# Patient Record
Sex: Female | Born: 1955 | Race: White | Hispanic: No | Marital: Married
Health system: Southern US, Community
[De-identification: ages and names within clinical notes are randomized; demographics above are authoritative.]

## PROBLEM LIST (undated history)

## (undated) DIAGNOSIS — I10 Essential (primary) hypertension: Secondary | ICD-10-CM

---

## 2017-10-21 ENCOUNTER — Other Ambulatory Visit (HOSPITAL_COMMUNITY): Payer: Self-pay | Admitting: Chiropractic Medicine

## 2017-10-21 DIAGNOSIS — M25551 Pain in right hip: Secondary | ICD-10-CM

## 2017-10-28 ENCOUNTER — Ambulatory Visit (HOSPITAL_COMMUNITY)
Admission: RE | Admit: 2017-10-28 | Discharge: 2017-10-28 | Disposition: A | Discharge status: Home / Self Care | Payer: BLUE CROSS/BLUE SHIELD | Source: Ambulatory Visit | Attending: Chiropractic Medicine | Admitting: Chiropractic Medicine

## 2017-10-28 ENCOUNTER — Encounter (HOSPITAL_COMMUNITY): Payer: Self-pay

## 2017-10-28 DIAGNOSIS — M25551 Pain in right hip: Secondary | ICD-10-CM

## 2017-10-28 DIAGNOSIS — S73191A Other sprain of right hip, initial encounter: Secondary | ICD-10-CM | POA: Insufficient documentation

## 2017-10-28 DIAGNOSIS — X58XXXA Exposure to other specified factors, initial encounter: Secondary | ICD-10-CM | POA: Insufficient documentation

## 2017-10-28 DIAGNOSIS — M949 Disorder of cartilage, unspecified: Secondary | ICD-10-CM | POA: Insufficient documentation

## 2017-10-28 HISTORY — DX: Essential (primary) hypertension: I10

## 2017-10-28 MED ORDER — POVIDONE-IODINE 10 % EX SOLN
CUTANEOUS | Status: AC
  Filled 2017-10-28: qty 15

## 2017-10-28 MED ORDER — LIDOCAINE HCL (PF) 1 % IJ SOLN
INTRAMUSCULAR | Status: AC
  Administered 2017-10-28: 3 mL
  Filled 2017-10-28: qty 5

## 2017-10-28 MED ORDER — IOPAMIDOL (ISOVUE-300) INJECTION 61%
INTRAVENOUS | Status: AC
  Administered 2017-10-28: 0.05 mL
  Filled 2017-10-28: qty 30

## 2017-10-28 MED ORDER — GADOBENATE DIMEGLUMINE 529 MG/ML IV SOLN
5.0000 mL | Freq: Once | INTRAVENOUS | Status: AC | PRN
  Administered 2017-10-28: 5 mL via INTRA_ARTICULAR

## 2017-10-28 NOTE — Procedures (Signed)
Preprocedure Dx: RT hip pain Postprocedure Dx: RT hip pain Procedure  Fluoroscopically guided RT joint injection for MR arthrography arthrogrpahy Radiologist:  Tyron RussellBoles Anesthesia:  3 ml of 1% lidocaine Injectate:  9 ml of [0.05 ml Multihance in 20 ml [id number]] Fluoro time:  1 minutes 12 seconds EBL:   None Complications: None

## 2019-01-25 IMAGING — MR MR HIP*R* W/CM
4 of 6 series · 13 of 40 positions shown · IV contrast (agent unspecified)
Comparison: None.

CLINICAL DATA: Right knee pain for 6 months

EXAM:
MRI OF THE RIGHT HIP WITH CONTRAST (MR Arthrogram)
TECHNIQUE: Multiplanar, multisequence MR imaging of the hip was performed
immediately following contrast injection into the hip joint under
fluoroscopic guidance. No intravenous contrast was administered.

[Series 2: T1 · coronal · 4.0mm · 0.37mm/px · 4 of 24 slices shown]
[im 1/24]
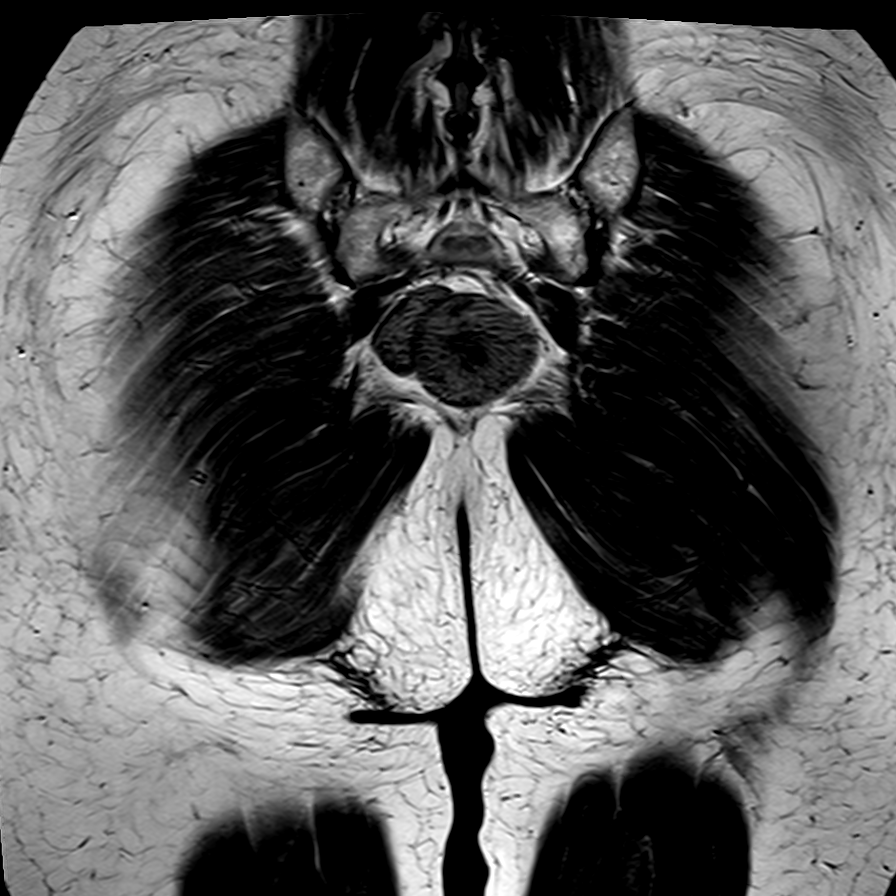
[im 6/24]
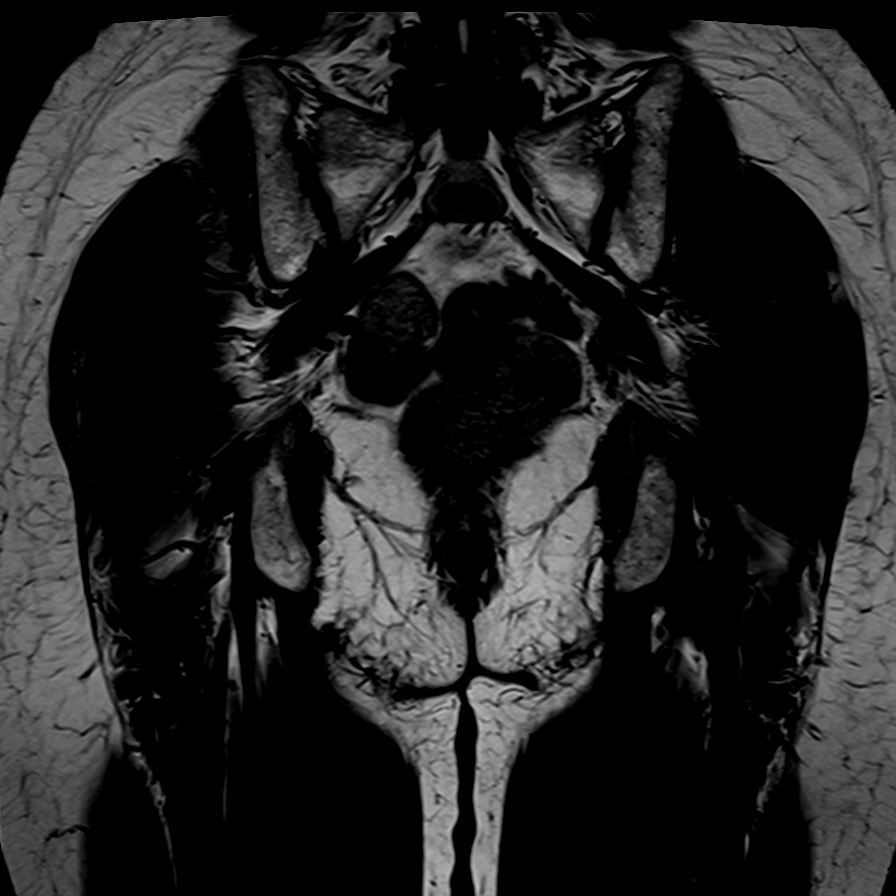
[im 12/24]
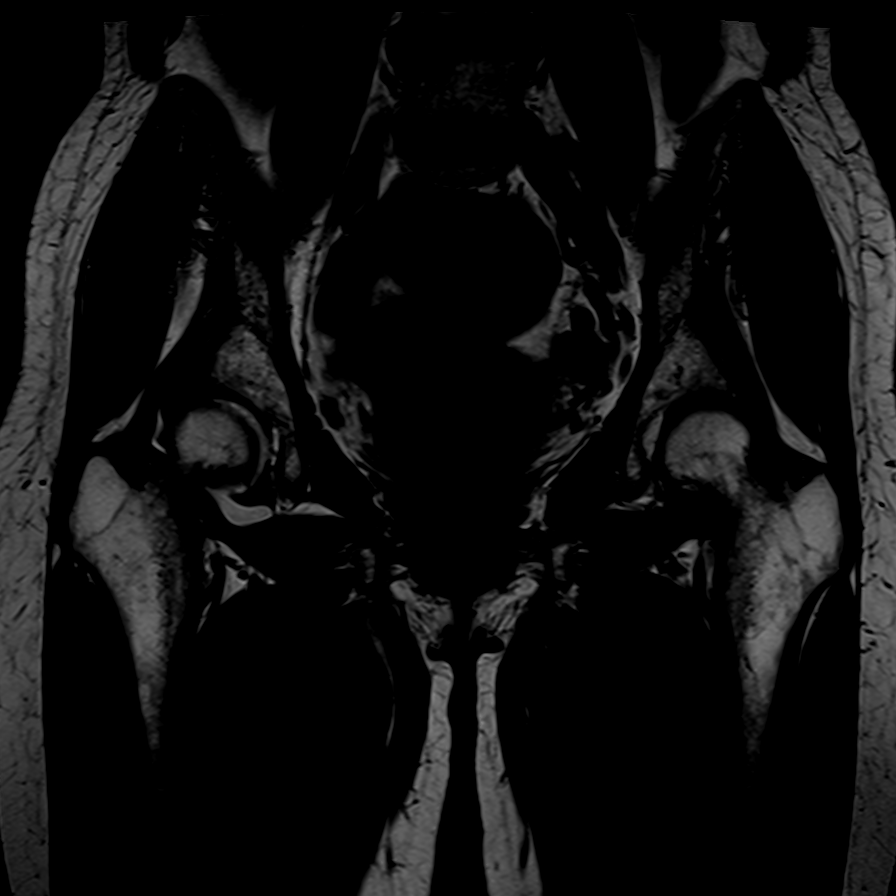
[im 24/24]
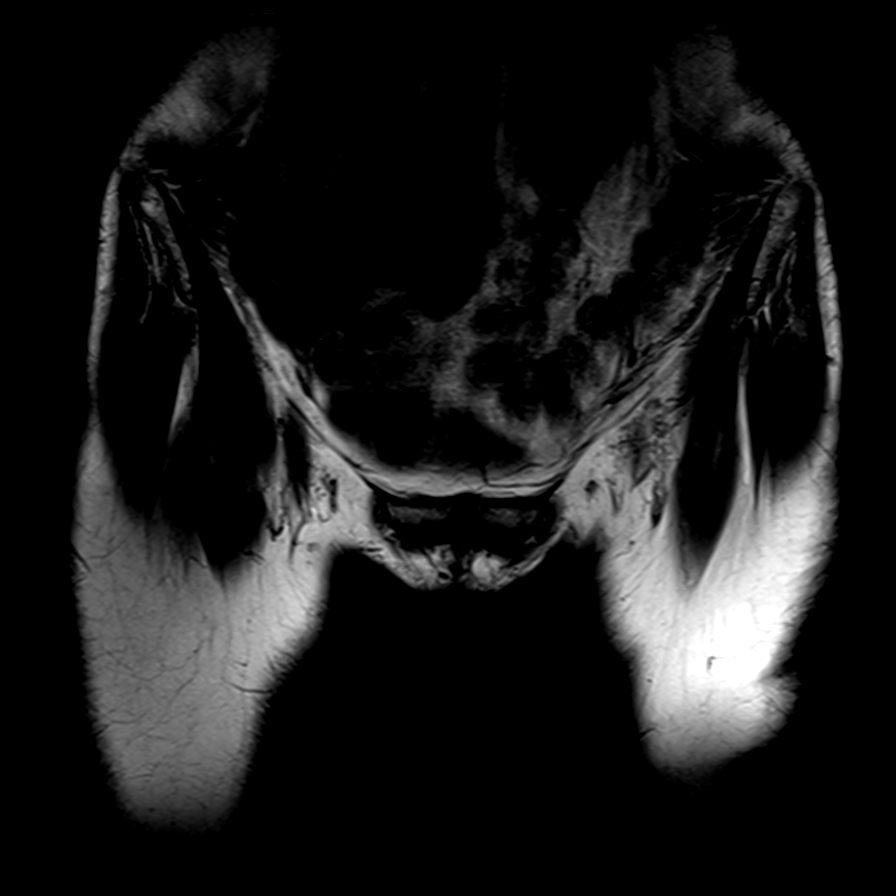

[Series 3: t1fs cor · coronal · 4.0mm · 0.40mm/px · 3 of 48 slices shown]
[im 5/48]
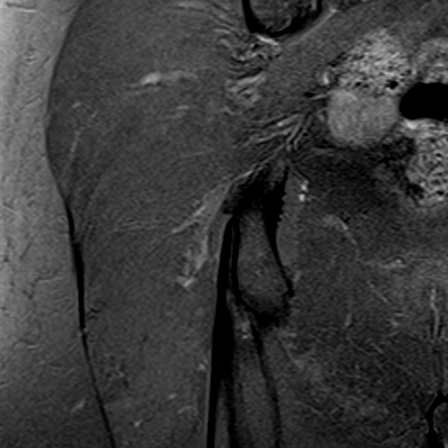
[im 24/48]
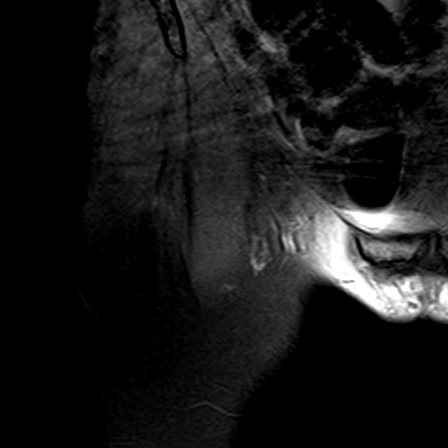
[im 43/48]
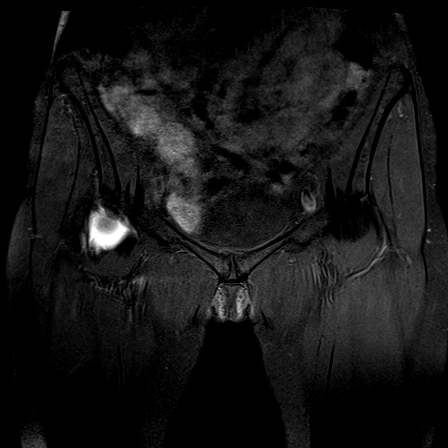

[Series 4: t1fs tra · axial · 4.0mm · 0.58mm/px · z∈[-53,+38]mm · 3 of 24 slices shown]
[im 5/24]
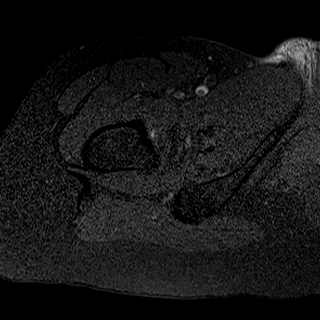
[im 14/24]
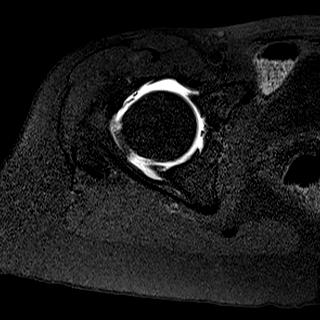
[im 24/24]
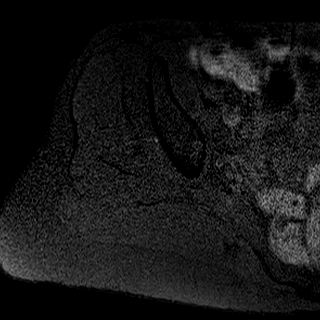

[Series 5: t1fs sag · sagittal · 4.0mm · 0.21mm/px · 3 of 24 slices shown]
[im 5/24]
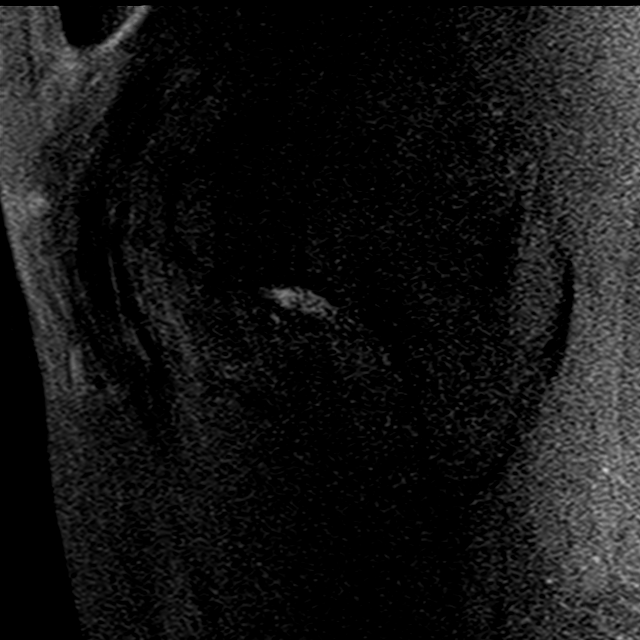
[im 14/24]
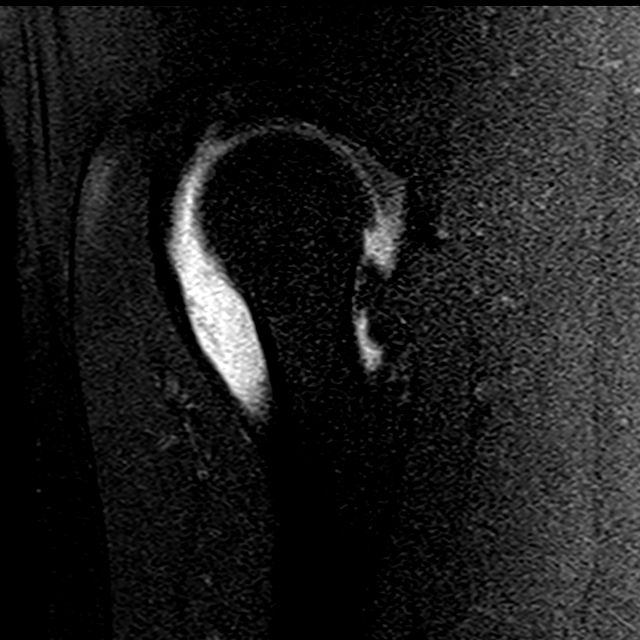
[im 24/24]
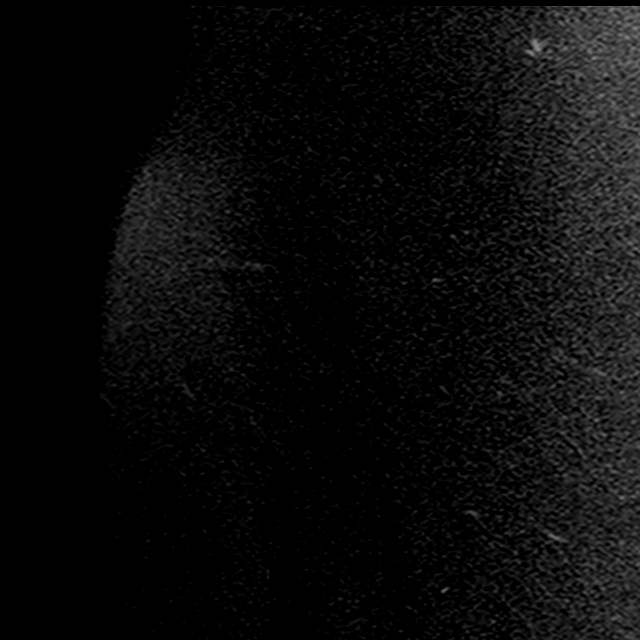

[13 of 40 positions shown; findings below may reference images not displayed]

FINDINGS: Bone

No hip fracture, dislocation or avascular necrosis.

Normal sacrum and sacroiliac joints.

No aggressive osseous lesion.

Alignment

Normal. No subluxation.

Dysplasia

None.

Joint effusion

Intraarticular contrast distending the right hip joint capsule. No
left hip joint effusion. No SI joint effusion.

Labrum

Superior anterior right labral tear.

Cartilage

Femoral cartilage: Partial-thickness cartilage loss of the right
femoral head.

Acetabular cartilage: High-grade partial-thickness cartilage loss of
the right acetabulum.

Capsule and ligaments

Normal.

Muscles and Tendons

Flexors: Normal.

Extensors: Normal.

Abductors: Normal.

Adductors: Normal.

Rotators: Normal.

Hamstrings: Normal.

Other Findings

None

Viscera

Normal. No abnormality seen in pelvis. No lymphadenopathy. No free
fluid in the pelvis.
IMPRESSION: 1. Superior anterior right labral tear.
2. Partial thickness cartilage loss with areas of high grade partial
thickness cartilage loss of the right femoral head and acetabulum.
